# Patient Record
Sex: Female | Born: 1935 | Race: White | Hispanic: No | Marital: Single
Health system: Southern US, Community
[De-identification: ages and names within clinical notes are randomized; demographics above are authoritative.]

## PROBLEM LIST (undated history)

## (undated) DIAGNOSIS — G459 Transient cerebral ischemic attack, unspecified: Secondary | ICD-10-CM

## (undated) DIAGNOSIS — H9190 Unspecified hearing loss, unspecified ear: Secondary | ICD-10-CM

## (undated) DIAGNOSIS — E559 Vitamin D deficiency, unspecified: Secondary | ICD-10-CM

## (undated) DIAGNOSIS — F0280 Dementia in other diseases classified elsewhere without behavioral disturbance: Secondary | ICD-10-CM

## (undated) DIAGNOSIS — E119 Type 2 diabetes mellitus without complications: Secondary | ICD-10-CM

## (undated) DIAGNOSIS — E782 Mixed hyperlipidemia: Secondary | ICD-10-CM

---

## 2015-09-21 DEATH — deceased

## 2020-06-23 ENCOUNTER — Emergency Department (HOSPITAL_COMMUNITY)
Admission: EM | Admit: 2020-06-23 | Discharge: 2020-06-23 | Disposition: A | Discharge status: Home / Self Care | Payer: Medicare Other | Attending: Emergency Medicine | Admitting: Emergency Medicine

## 2020-06-23 ENCOUNTER — Emergency Department (HOSPITAL_COMMUNITY): Payer: Medicare Other

## 2020-06-23 ENCOUNTER — Other Ambulatory Visit: Payer: Self-pay

## 2020-06-23 ENCOUNTER — Encounter (HOSPITAL_COMMUNITY): Payer: Self-pay

## 2020-06-23 DIAGNOSIS — Y92129 Unspecified place in nursing home as the place of occurrence of the external cause: Secondary | ICD-10-CM | POA: Diagnosis not present

## 2020-06-23 DIAGNOSIS — E119 Type 2 diabetes mellitus without complications: Secondary | ICD-10-CM | POA: Diagnosis not present

## 2020-06-23 DIAGNOSIS — W01198A Fall on same level from slipping, tripping and stumbling with subsequent striking against other object, initial encounter: Secondary | ICD-10-CM | POA: Insufficient documentation

## 2020-06-23 DIAGNOSIS — G309 Alzheimer's disease, unspecified: Secondary | ICD-10-CM | POA: Insufficient documentation

## 2020-06-23 DIAGNOSIS — W19XXXA Unspecified fall, initial encounter: Secondary | ICD-10-CM

## 2020-06-23 DIAGNOSIS — N390 Urinary tract infection, site not specified: Secondary | ICD-10-CM | POA: Diagnosis not present

## 2020-06-23 DIAGNOSIS — S0990XA Unspecified injury of head, initial encounter: Secondary | ICD-10-CM | POA: Diagnosis present

## 2020-06-23 HISTORY — DX: Vitamin D deficiency, unspecified: E55.9

## 2020-06-23 HISTORY — DX: Mixed hyperlipidemia: E78.2

## 2020-06-23 HISTORY — DX: Unspecified hearing loss, unspecified ear: H91.90

## 2020-06-23 HISTORY — DX: Dementia in other diseases classified elsewhere, unspecified severity, without behavioral disturbance, psychotic disturbance, mood disturbance, and anxiety: F02.80

## 2020-06-23 HISTORY — DX: Type 2 diabetes mellitus without complications: E11.9

## 2020-06-23 HISTORY — DX: Transient cerebral ischemic attack, unspecified: G45.9

## 2020-06-23 LAB — CBC WITH DIFFERENTIAL/PLATELET
Abs Immature Granulocytes: 0.04 10*3/uL (ref 0.00–0.07)
Basophils Absolute: 0 10*3/uL (ref 0.0–0.1)
Basophils Relative: 0 %
Eosinophils Absolute: 0.3 10*3/uL (ref 0.0–0.5)
Eosinophils Relative: 4 %
HCT: 37.8 % (ref 36.0–46.0)
Hemoglobin: 11.8 g/dL — ABNORMAL LOW (ref 12.0–15.0)
Immature Granulocytes: 1 %
Lymphocytes Relative: 27 %
Lymphs Abs: 1.7 10*3/uL (ref 0.7–4.0)
MCH: 27.9 pg (ref 26.0–34.0)
MCHC: 31.2 g/dL (ref 30.0–36.0)
MCV: 89.4 fL (ref 80.0–100.0)
Monocytes Absolute: 0.5 10*3/uL (ref 0.1–1.0)
Monocytes Relative: 7 %
Neutro Abs: 3.7 10*3/uL (ref 1.7–7.7)
Neutrophils Relative %: 61 %
Platelets: 214 10*3/uL (ref 150–400)
RBC: 4.23 MIL/uL (ref 3.87–5.11)
RDW: 13.6 % (ref 11.5–15.5)
WBC: 6.2 10*3/uL (ref 4.0–10.5)
nRBC: 0 % (ref 0.0–0.2)

## 2020-06-23 LAB — COMPREHENSIVE METABOLIC PANEL
ALT: 17 U/L (ref 0–44)
AST: 24 U/L (ref 15–41)
Albumin: 3.2 g/dL — ABNORMAL LOW (ref 3.5–5.0)
Alkaline Phosphatase: 63 U/L (ref 38–126)
Anion gap: 10 (ref 5–15)
BUN: 24 mg/dL — ABNORMAL HIGH (ref 8–23)
CO2: 22 mmol/L (ref 22–32)
Calcium: 9 mg/dL (ref 8.9–10.3)
Chloride: 106 mmol/L (ref 98–111)
Creatinine, Ser: 1.09 mg/dL — ABNORMAL HIGH (ref 0.44–1.00)
GFR, Estimated: 48 mL/min — ABNORMAL LOW (ref >60)
Glucose, Bld: 96 mg/dL (ref 70–99)
Potassium: 3.9 mmol/L (ref 3.5–5.1)
Sodium: 138 mmol/L (ref 135–145)
Total Bilirubin: 0.1 mg/dL — ABNORMAL LOW (ref 0.3–1.2)
Total Protein: 6.3 g/dL — ABNORMAL LOW (ref 6.5–8.1)

## 2020-06-23 LAB — URINALYSIS, ROUTINE W REFLEX MICROSCOPIC
Bilirubin Urine: NEGATIVE
Glucose, UA: NEGATIVE mg/dL
Hgb urine dipstick: NEGATIVE
Ketones, ur: NEGATIVE mg/dL
Nitrite: POSITIVE — AB
Protein, ur: 30 mg/dL — AB
Specific Gravity, Urine: 1.018 (ref 1.005–1.030)
pH: 5 (ref 5.0–8.0)

## 2020-06-23 MED ORDER — CEPHALEXIN 250 MG PO CAPS: 250.0000 mg | ORAL_CAPSULE | Freq: Four times a day (QID) | ORAL | 0 refills | Status: AC

## 2020-06-23 MED ORDER — CEPHALEXIN 250 MG PO CAPS
250.0000 mg | ORAL_CAPSULE | Freq: Once | ORAL | Status: AC
  Administered 2020-06-23: 250 mg via ORAL
  Filled 2020-06-23: qty 1

## 2020-06-23 NOTE — ED Notes (Signed)
Patient transported to X-ray 

## 2020-06-23 NOTE — ED Notes (Signed)
Patient transported to CT 

## 2020-06-23 NOTE — ED Notes (Signed)
Discharge instructions also gone over with daughter at bedside. Discharge papers given to daughter. DNR form with daughter. Changed pt to regular clothes, placed in wheelchair.

## 2020-06-23 NOTE — ED Triage Notes (Signed)
Pt arrived via [facility] from [facility] for a fall. Pt states pt didn't hit head or LOC. Staff told EMS, pt not on blood thinners. Pt denies pain. EMS did not note any injuries. Pt Is A&O to self only, which is her baseline.

## 2020-06-23 NOTE — ED Provider Notes (Signed)
[facility] EMERGENCY DEPARTMENT Provider Note   CSN: [id number] Arrival date & time: 06/23/20  1743     History Chief Complaint  Patient presents with  . Fall    Katherine Page is a 86 y.o. female presenting for evaluation after a fall.  Level 5 caveat due to dementia.  Per EMS, [facility], no reported injuries.  Mental status at baseline.  I discussed with facility staff.  They state they heard a thud, found patient in her room laying on her back.  They think that she hit her head.  She did not lose consciousness.  No obvious injury noted at the time.  She is not on blood thinners.  She was mentating at her baseline.  She normally walks with assistance with a walker.  No recent medication changes or illnesses.  Additional history taken chart review.  Patient with a history of diabetes, Alzheimer's, TIA, hyperlipidemia  HPI     Past Medical History:  Diagnosis Date  . Alzheimer disease (HCC)   . Diabetes mellitus without complication (HCC)   . Hard of hearing   . Mixed hyperlipidemia   . TIA (transient ischemic attack)   . Vitamin D deficiency     There are no problems to display for this patient.    OB History   No obstetric history on file.     No family history on file.  Social History   Tobacco Use  . Smoking status: Not on file  Substance Use Topics  . Alcohol use: Not on file  . Drug use: Not on file    Home Medications Prior to Admission medications   Medication Sig Start Date End Date Taking? Authorizing Provider  cephALEXin (KEFLEX) 250 MG capsule Take 1 capsule (250 mg total) by mouth 4 (four) times daily for 5 days. 06/23/20 06/28/20  Cimberly Stoffel, PA-C    Allergies    Neosporin [bacitracin-polymyxin b] and Penicillins  Review of Systems   Review of Systems  Unable to perform ROS: Dementia    Physical Exam Updated Vital Signs BP (!) 136/117 (BP Location: Left Arm)   Pulse (!) 59    Temp 98.3 F (36.8 C) (Oral)   Resp 12   Ht 5\' 5"  (1.651 m)   Wt 68 kg   SpO2 98%   BMI 24.96 kg/m   Physical Exam Vitals and nursing note reviewed.  Constitutional:      General: She is not in acute distress.    Appearance: She is well-developed.     Comments: Appears nontoxic  HENT:     Head: Normocephalic and atraumatic.  Eyes:     Extraocular Movements: Extraocular movements intact.     Conjunctiva/sclera: Conjunctivae normal.     Pupils: Pupils are equal, round, and reactive to light.  Neck:     Comments: No TTP of midline C-spine. Cardiovascular:     Rate and Rhythm: Normal rate and regular rhythm.     Pulses: Normal pulses.  Pulmonary:     Effort: Pulmonary effort is normal. No respiratory distress.     Breath sounds: Normal breath sounds. No wheezing.  Abdominal:     General: There is no distension.     Palpations: Abdomen is soft. There is no mass.     Tenderness: There is no abdominal tenderness. There is no guarding or rebound.  Musculoskeletal:        General: Normal range of motion.     Cervical back:  Normal range of motion and neck supple.     Comments: No TTP of back or midline spine.  No step-offs or deformities.  Pelvis stable and intact.  Able to go from laying to sitting without pain or difficulty.  Able to lift legs without difficulty or pain.  No obvious deformity.  Skin:    General: Skin is warm and dry.     Capillary Refill: Capillary refill takes less than 2 seconds.  Neurological:     Mental Status: She is alert and oriented to person, place, and time.     ED Results / Procedures / Treatments   Labs (all labs ordered are listed, but only abnormal results are displayed) Labs Reviewed  CBC WITH DIFFERENTIAL/PLATELET - Abnormal; Notable for the following components:      Result Value   Hemoglobin 11.8 (*)    All other components within normal limits  COMPREHENSIVE METABOLIC PANEL - Abnormal; Notable for the following components:   BUN 24 (*)     Creatinine, Ser 1.09 (*)    Total Protein 6.3 (*)    Albumin 3.2 (*)    Total Bilirubin 0.1 (*)    GFR, Estimated 48 (*)    All other components within normal limits  URINALYSIS, ROUTINE W REFLEX MICROSCOPIC - Abnormal; Notable for the following components:   APPearance HAZY (*)    Protein, ur 30 (*)    Nitrite POSITIVE (*)    Leukocytes,Ua SMALL (*)    Bacteria, UA RARE (*)    All other components within normal limits  URINE CULTURE    EKG EKG Interpretation  Date/Time:  Thursday June 23 2020 18:57:21 EST Ventricular Rate:  61 PR Interval:    QRS Duration: 105 QT Interval:  444 QTC Calculation: 448 R Axis:   -42 Text Interpretation: Sinus rhythm LVH with secondary repolarization abnormality Inferior infarct, old No previous ECGs available Confirmed by Tilden Fossa [phone]) on 06/23/2020 7:03:52 PM   Radiology DG Chest 2 View  Result Date: 06/23/2020 CLINICAL DATA:  Recent fall, initial encounter EXAM: CHEST - 2 VIEW COMPARISON:  05/29/2018 FINDINGS: Cardiac shadow is within normal limits. Lungs are clear bilaterally. No effusion or pneumothorax is seen. Significant degenerative changes of the shoulder joints are again identified and stable. No acute abnormality noted. IMPRESSION: No active cardiopulmonary disease. Electronically Signed   By: Alcide Clever M.D.   On: 06/23/2020 19:06   CT Head Wo Contrast  Result Date: 06/23/2020 CLINICAL DATA:  86 year old female with head trauma. EXAM: CT HEAD WITHOUT CONTRAST TECHNIQUE: Contiguous axial images were obtained from the base of the skull through the vertex without intravenous contrast. COMPARISON:  Head CT dated 05/29/2018. FINDINGS: Brain: Moderate age-related atrophy and chronic microvascular ischemic changes. There is no acute intracranial hemorrhage. No mass effect or midline shift. No extra-axial fluid collection. Stable small left parafalcine calcific focus similar to prior CT, possibly a small calcified meningioma.  Vascular: No hyperdense vessel or unexpected calcification. Skull: Normal. Negative for fracture or focal lesion. Sinuses/Orbits: No acute finding. Other: None IMPRESSION: 1. No acute intracranial pathology. 2. Moderate age-related atrophy and chronic microvascular ischemic changes. Electronically Signed   By: Elgie Collard M.D.   On: 06/23/2020 19:33    Procedures Procedures (including critical care time)  Medications Ordered in ED Medications  cephALEXin (KEFLEX) capsule 250 mg (has no administration in time range)    ED Course  I have reviewed the triage vital signs and the nursing notes.  Pertinent labs & imaging  results that were available during my care of the patient were reviewed by me and considered in my medical decision making (see chart for details).    MDM Rules/Calculators/A&P                          Patient resenting for evaluation after a fall.  No known injuries.  However fall was unwitnessed, patient may have hit her head.  Considering patient's age, will obtain CT head and neck.  As follows unwitnessed, will obtain basic labs, EKG, and urine to ensure no underlying metabolic process.  If reassuring, likely can be discharged.  Labs interpreted by me, overall reassuring.  Electrolyte stable.  Hemoglobin stable.  Leukocytosis.  Chest x-ray viewed interpreted by me, no fracture, dislocation, or sign of infection.  CT head negative for acute findings.  UA pending.  Case discussed with attending, Dr. Madilyn Hook evaluated the patient.  UA c/w with infection. Will tx with keflex (pt has pcn allergy listed, but has had cephalosporins without rxn). Discussed findings with pt and pt's niece. At this time, pt appears safe for d/c. Return precautions given. Pt and niece state they understand and agree to plan.   Final Clinical Impression(s) / ED Diagnoses Final diagnoses:  Fall, initial encounter  Urinary tract infection without hematuria, site unspecified    Rx / DC Orders ED  Discharge Orders         Ordered    cephALEXin (KEFLEX) 250 MG capsule  4 times daily        06/23/20 2050           Alveria Apley, PA-C 06/23/20 2109    Tilden Fossa, MD 06/23/20 2359

## 2020-06-23 NOTE — Discharge Instructions (Addendum)
Your urine was consistent with infection.  Take antibiotics as prescribed.  Take entire course, even if symptoms improve.  Continue taking home medications as prescribed. Follow-up with your primary care doctor as needed for further evaluation. Return to the emergency room with any new, worsening, or concerning symptoms.

## 2020-06-23 NOTE — ED Notes (Signed)
Report given to Erenest Rasher Med tech of [facility]. Daughter will take pt back to facility.

## 2020-06-23 NOTE — ED Notes (Signed)
Pt able to move all extremities

## 2020-06-23 NOTE — ED Notes (Signed)
Pt back to room at this time

## 2020-06-25 LAB — URINE CULTURE

## 2021-12-27 IMAGING — CR DG CHEST 2V
2 series · 2 of 2 positions shown · non-contrast
Comparison: 05/29/2018

CLINICAL DATA: Recent fall, initial encounter

EXAM:
CHEST - 2 VIEW

[chest lat]
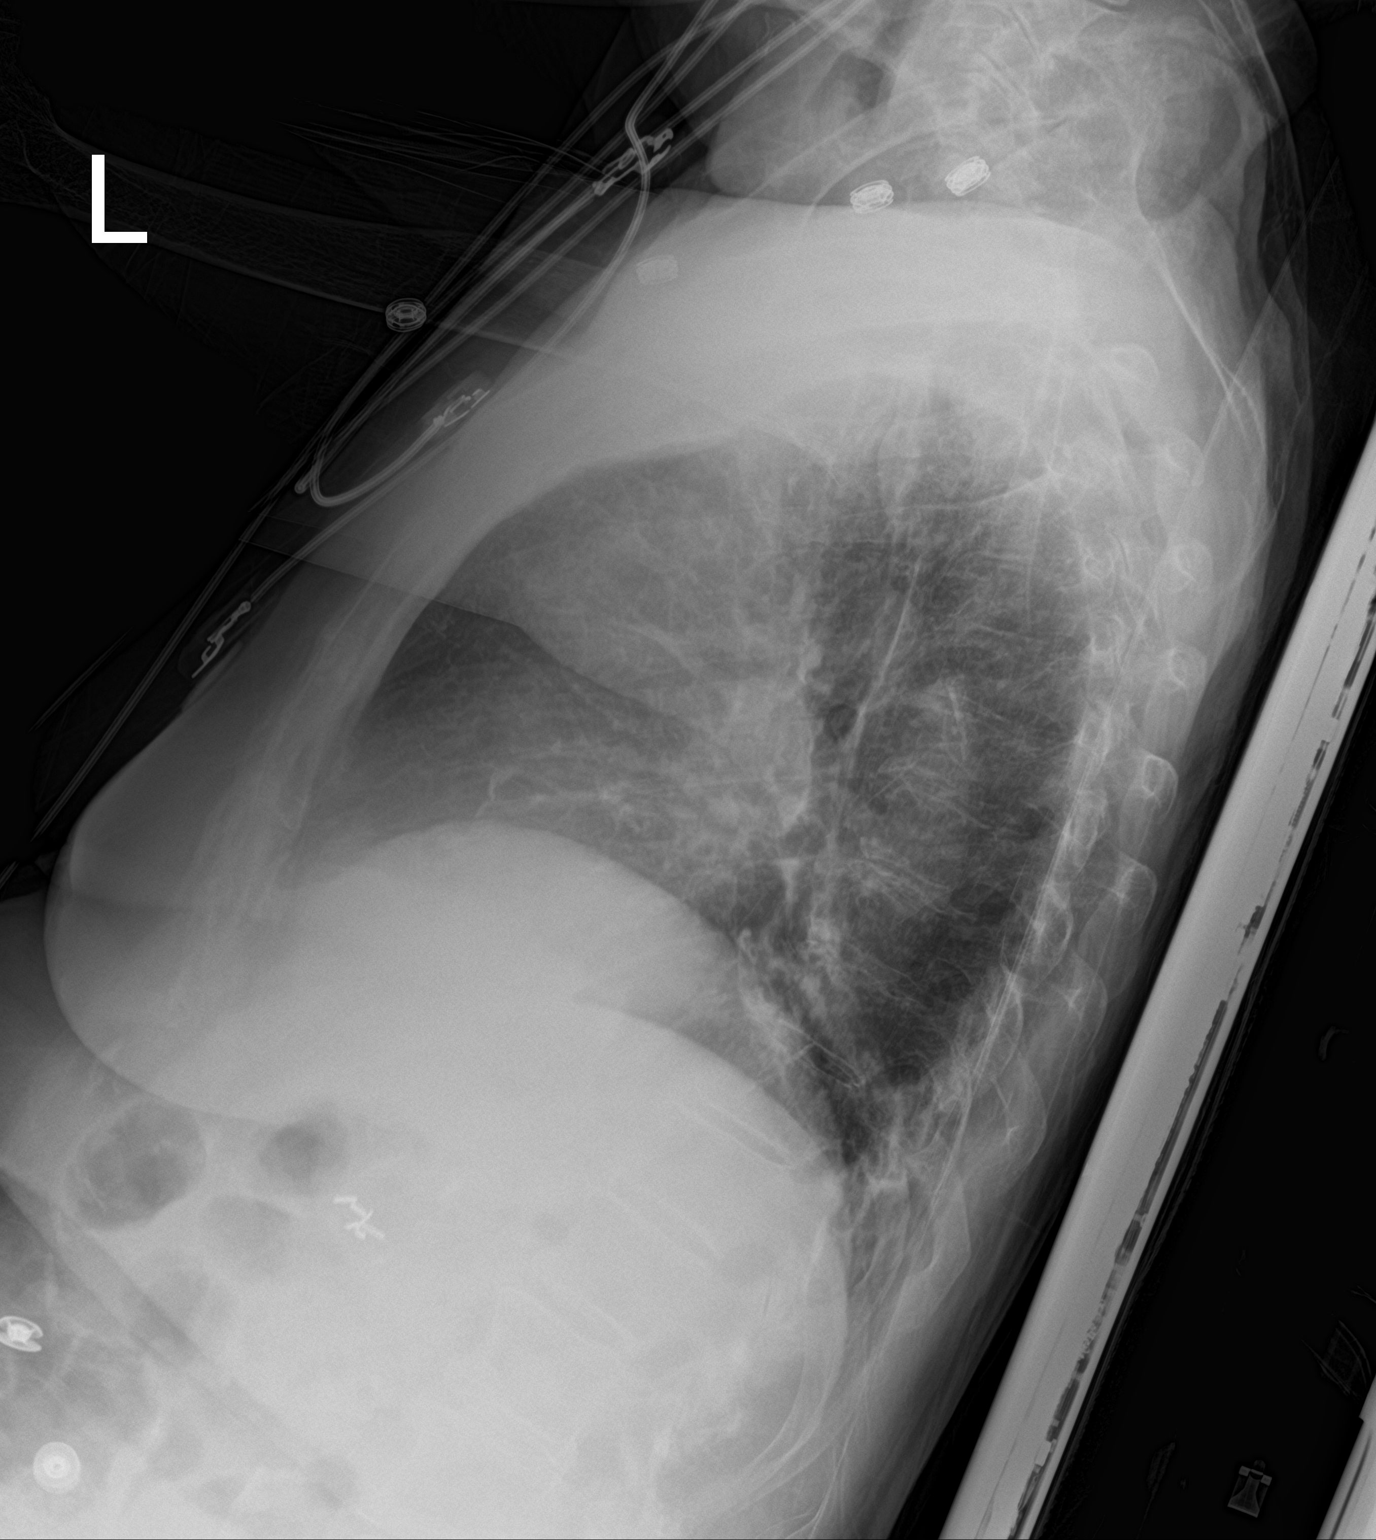

[chest ap]
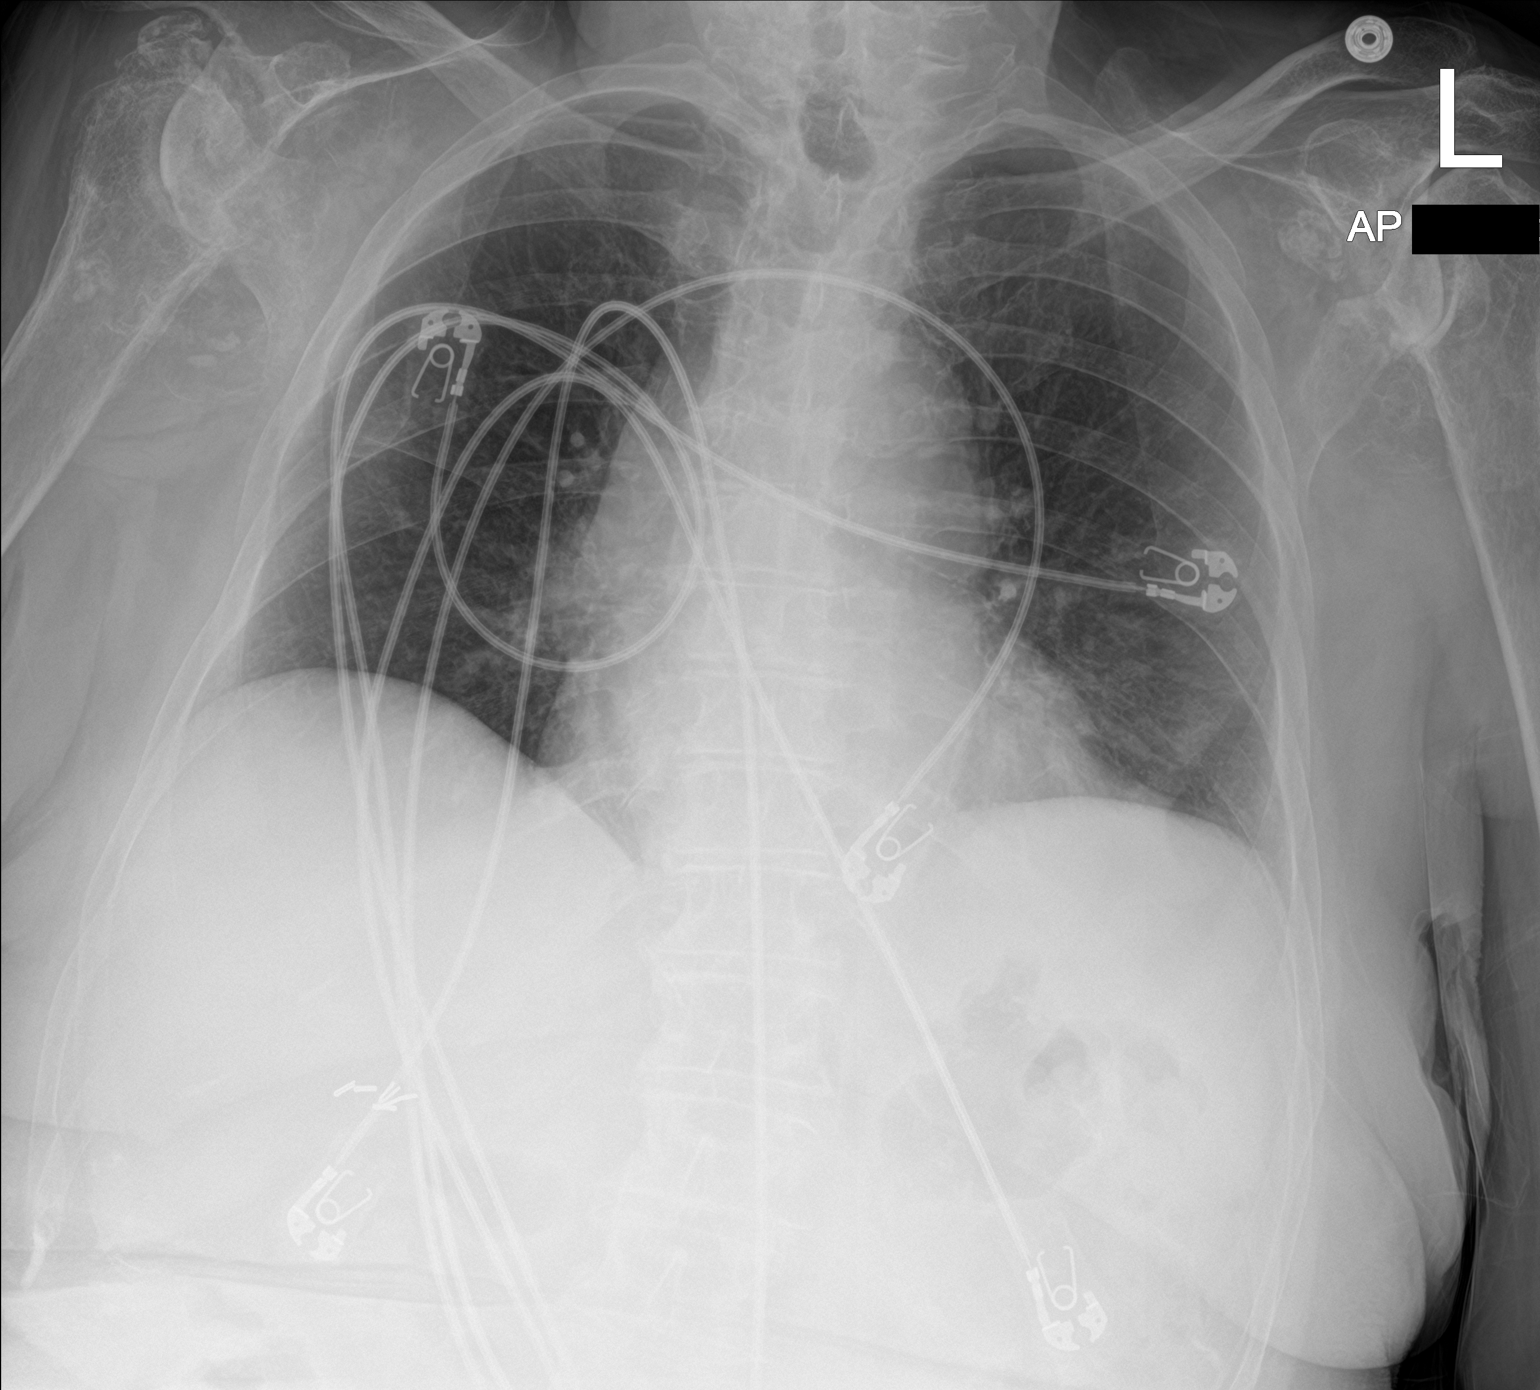

[2 of 2 positions shown; findings below may reference images not displayed]

FINDINGS: Cardiac shadow is within normal limits. Lungs are clear bilaterally.
No effusion or pneumothorax is seen. Significant degenerative
changes of the shoulder joints are again identified and stable. No
acute abnormality noted.
IMPRESSION: No active cardiopulmonary disease.

## 2021-12-27 IMAGING — CT CT HEAD W/O CM
4 series · 17 of 47 positions shown, 19 images · non-contrast
Comparison: Head CT dated 05/29/2018.

CLINICAL DATA: [AGE] female with head trauma.

EXAM:
CT HEAD WITHOUT CONTRAST
TECHNIQUE: Contiguous axial images were obtained from the base of the skull
through the vertex without intravenous contrast.

[Series 3: head without · axial · non-contrast · 0.46mm/px · z∈[+1115,+1235]mm · 7 of 33 slices shown, 9 images]
[im 5/33  brain]
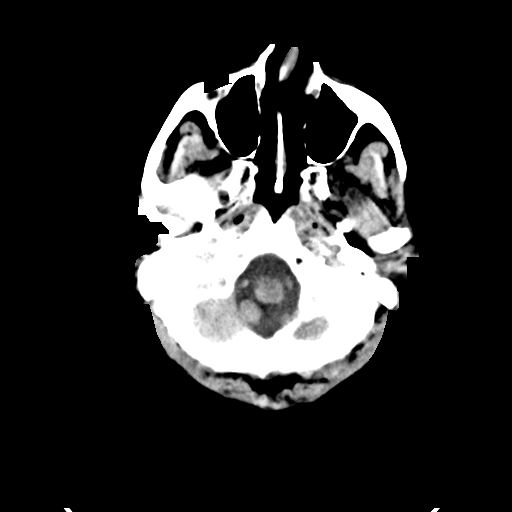
[im 5/33  bone]
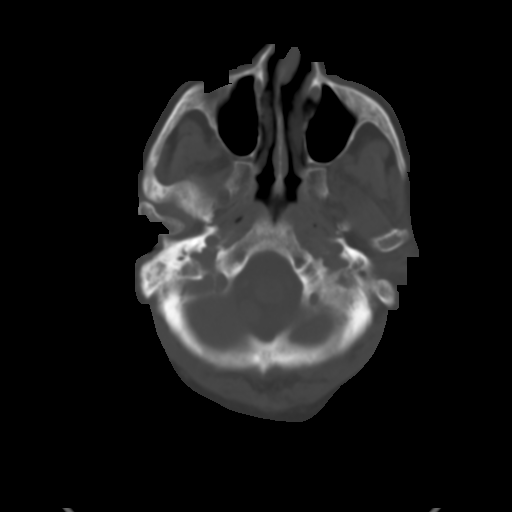
[im 9/33  brain]
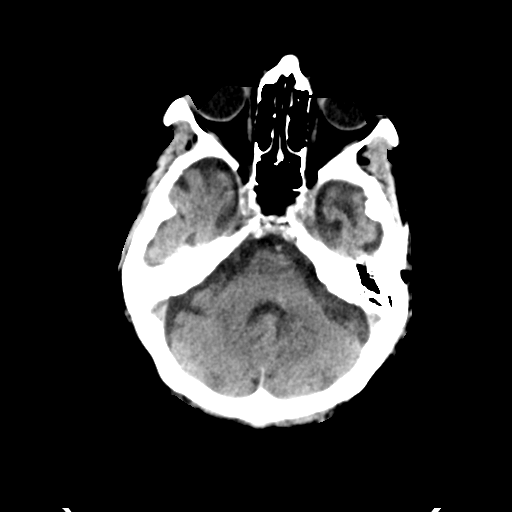
[im 13/33  brain]
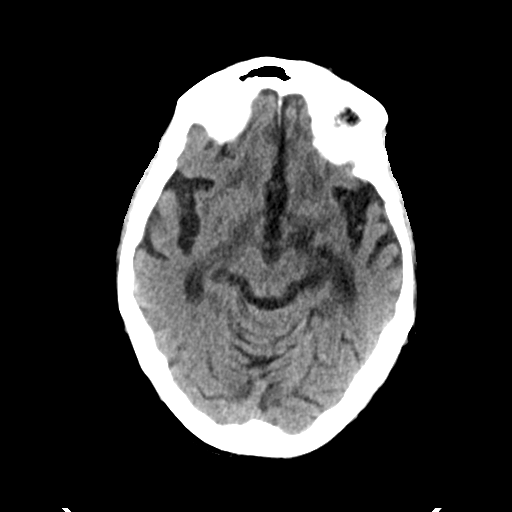
[im 17/33  brain]
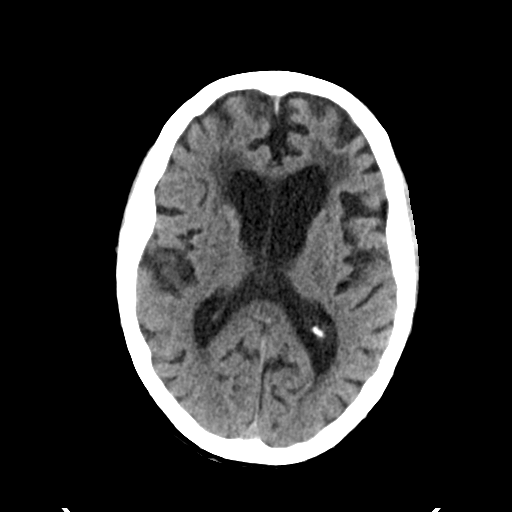
[im 21/33  brain]
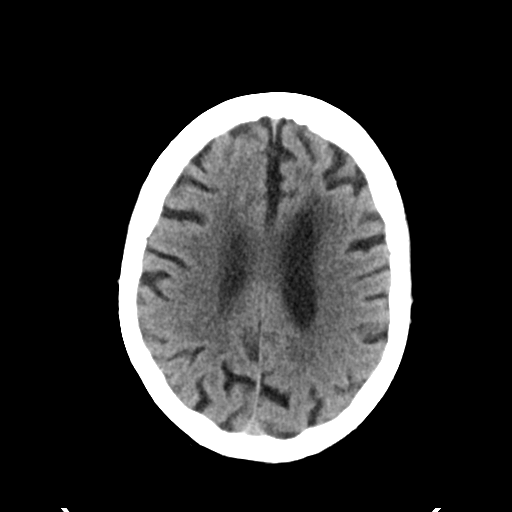
[im 21/33  bone]
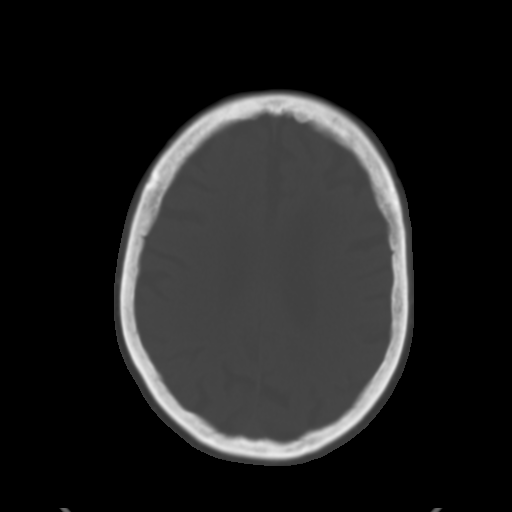
[im 25/33  brain]
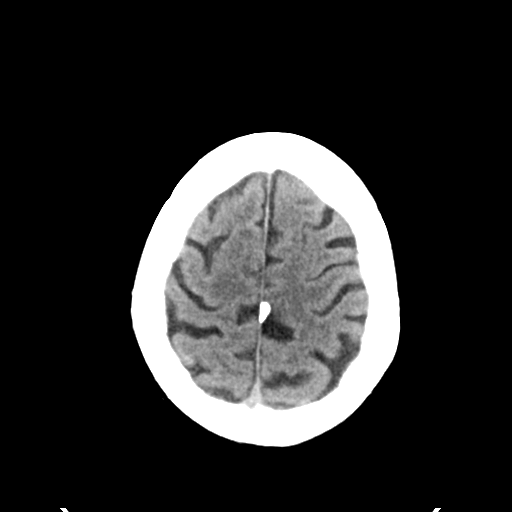
[im 29/33  brain]
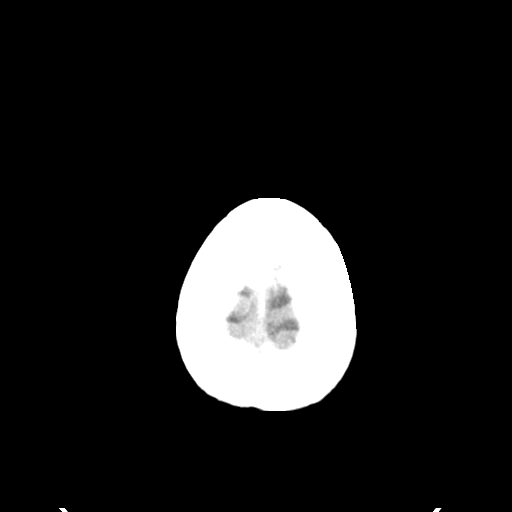

[Series 4: head bone · axial · 0.46mm/px · z∈[+1111,+1167]mm · 4 of 83 slices shown]
[im 9/83  bone]
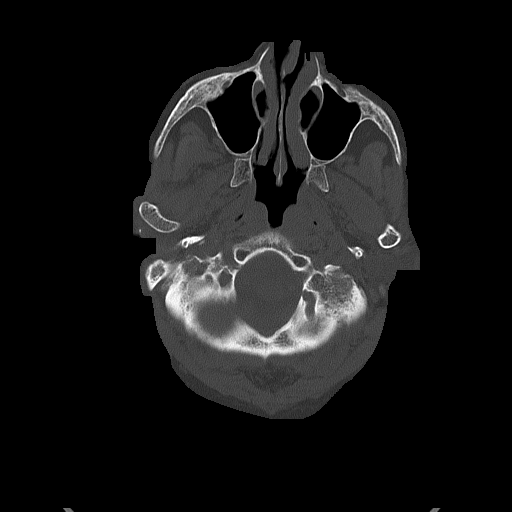
[im 17/83  bone]
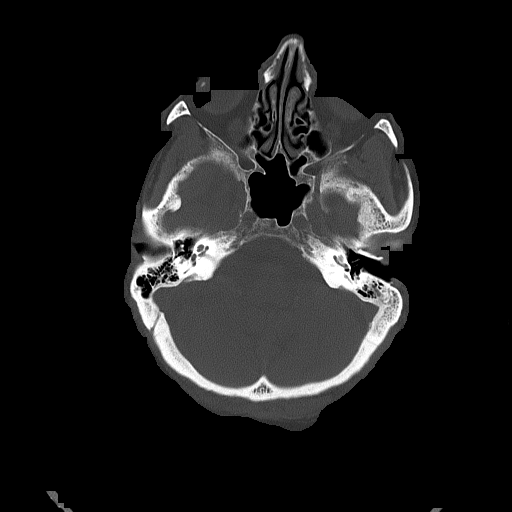
[im 25/83  bone]
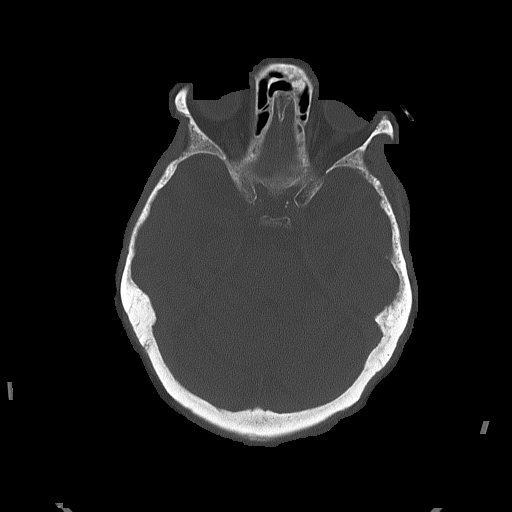
[im 37/83  bone]
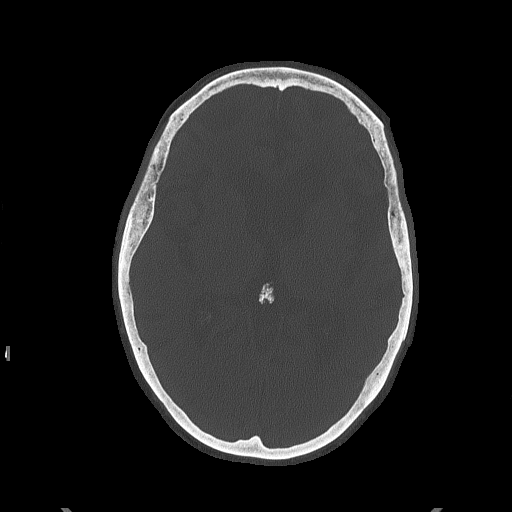

[Series 5: head without cor · coronal · non-contrast · 0.32mm/px · 3 of 72 slices shown]
[im 24/72  brain]
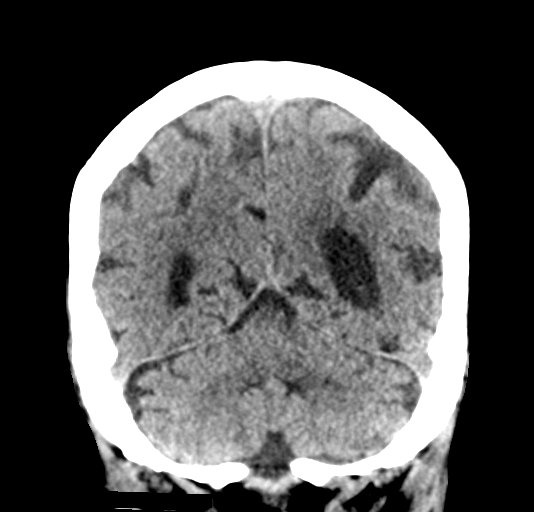
[im 32/72  brain]
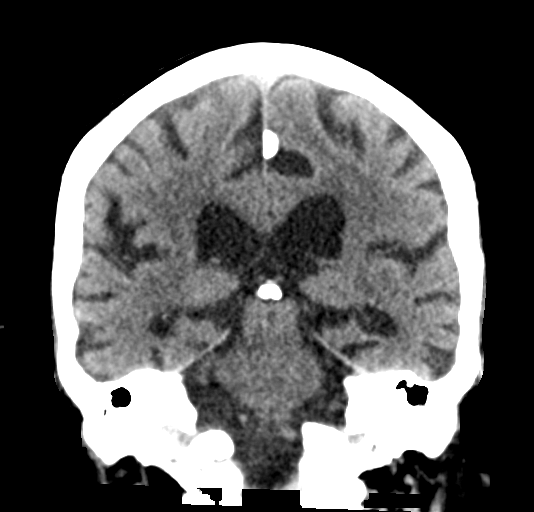
[im 40/72  brain]
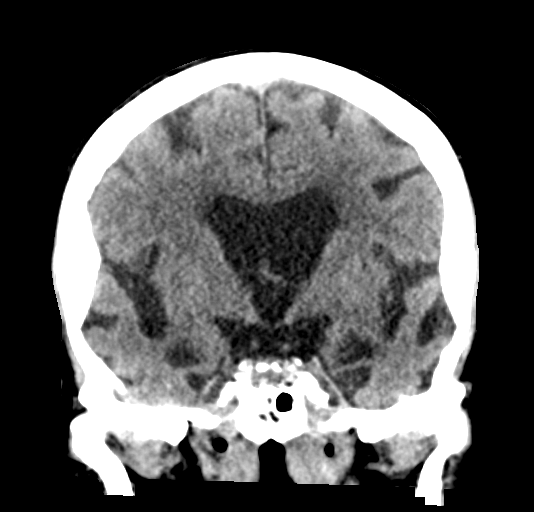

[Series 6: head without sag · sagittal · non-contrast · 0.32mm/px · 3 of 60 slices shown]
[im 20/60  brain]
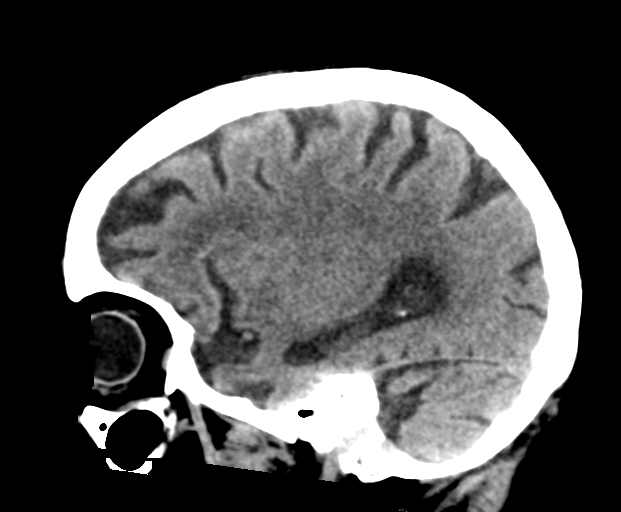
[im 30/60  brain]
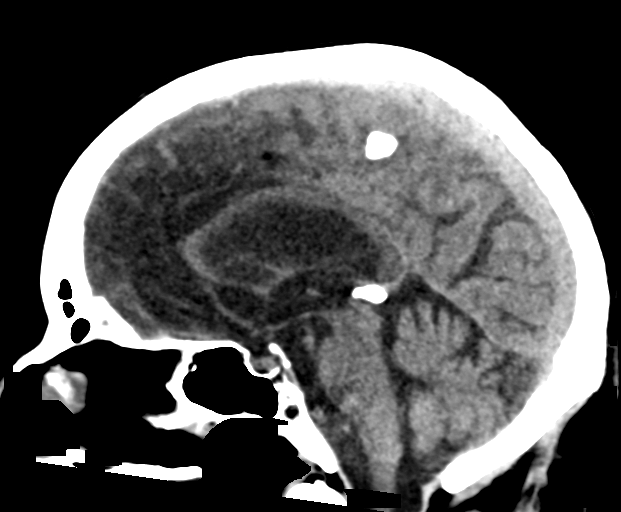
[im 40/60  brain]
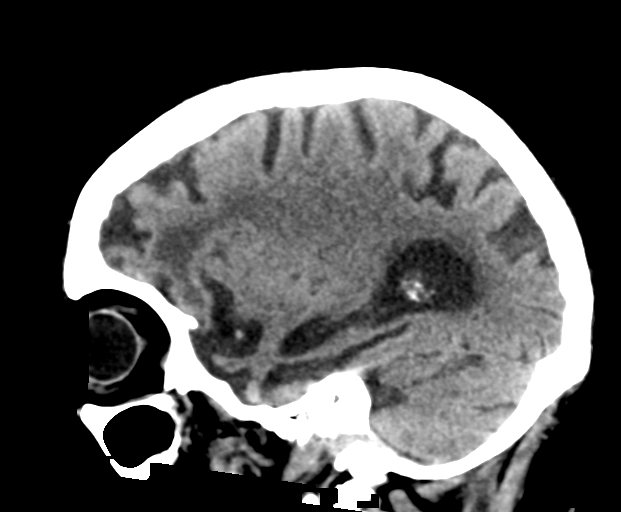

[17 of 47 positions shown; findings below may reference images not displayed]

FINDINGS: Brain: Moderate age-related atrophy and chronic microvascular
ischemic changes. There is no acute intracranial hemorrhage. No mass
effect or midline shift. No extra-axial fluid collection. Stable
small left parafalcine calcific focus similar to prior CT, possibly
a small calcified meningioma.

Vascular: No hyperdense vessel or unexpected calcification.

Skull: Normal. Negative for fracture or focal lesion.

Sinuses/Orbits: No acute finding.

Other: None
IMPRESSION: 1. No acute intracranial pathology.
2. Moderate age-related atrophy and chronic microvascular ischemic
changes.
